# Patient Record
Sex: Male | Born: 1990 | Race: White | Hispanic: No | Marital: Single | Smoking: Current every day smoker
Health system: Southern US, Community
[De-identification: ages and names within clinical notes are randomized; demographics above are authoritative.]

## PROBLEM LIST (undated history)

## (undated) DIAGNOSIS — J45909 Unspecified asthma, uncomplicated: Secondary | ICD-10-CM

---

## 2011-05-16 ENCOUNTER — Emergency Department (HOSPITAL_COMMUNITY)
Admission: EM | Admit: 2011-05-16 | Discharge: 2011-05-16 | Disposition: A | Discharge status: Home / Self Care | Payer: No Typology Code available for payment source | Attending: Emergency Medicine | Admitting: Emergency Medicine

## 2011-05-16 ENCOUNTER — Encounter: Payer: Self-pay | Admitting: *Deleted

## 2011-05-16 ENCOUNTER — Emergency Department (HOSPITAL_COMMUNITY): Payer: No Typology Code available for payment source

## 2011-05-16 DIAGNOSIS — IMO0002 Reserved for concepts with insufficient information to code with codable children: Secondary | ICD-10-CM | POA: Insufficient documentation

## 2011-05-16 DIAGNOSIS — T148XXA Other injury of unspecified body region, initial encounter: Secondary | ICD-10-CM

## 2011-05-16 DIAGNOSIS — F17200 Nicotine dependence, unspecified, uncomplicated: Secondary | ICD-10-CM | POA: Insufficient documentation

## 2011-05-16 MED ORDER — DIAZEPAM 5 MG PO TABS: 5.0000 mg | ORAL_TABLET | Freq: Three times a day (TID) | ORAL | Status: AC | PRN

## 2011-05-16 MED ORDER — IBUPROFEN 800 MG PO TABS
800.0000 mg | ORAL_TABLET | Freq: Once | ORAL | Status: AC
  Administered 2011-05-16: 800 mg via ORAL
  Filled 2011-05-16: qty 1

## 2011-05-16 MED ORDER — DIAZEPAM 5 MG PO TABS
5.0000 mg | ORAL_TABLET | Freq: Once | ORAL | Status: AC
  Administered 2011-05-16: 5 mg via ORAL
  Filled 2011-05-16: qty 1

## 2011-05-16 MED ORDER — IBUPROFEN 600 MG PO TABS: 600.0000 mg | ORAL_TABLET | Freq: Four times a day (QID) | ORAL | Status: AC | PRN
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
## 2011-05-16 NOTE — ED Notes (Signed)
Pt taken to xray. Nad.  

## 2011-05-16 NOTE — ED Notes (Signed)
c collar applied. Pt c/o pain to sides of neck but had pain with palpation to cervical and upper thoracic area.

## 2011-05-31 NOTE — ED Provider Notes (Signed)
Medical screening examination/treatment/procedure(s) were performed by non-physician practitioner and as supervising physician I was immediately available for consultation/collaboration. Damion Kant, MD, FACEP    Briele Lagasse L Briana Farner, MD 05/31/11 0042 

## 2018-10-22 ENCOUNTER — Other Ambulatory Visit: Payer: Self-pay

## 2018-10-22 ENCOUNTER — Encounter (HOSPITAL_COMMUNITY): Payer: Self-pay

## 2018-10-22 ENCOUNTER — Emergency Department (HOSPITAL_COMMUNITY): Payer: 59

## 2018-10-22 DIAGNOSIS — Y92410 Unspecified street and highway as the place of occurrence of the external cause: Secondary | ICD-10-CM | POA: Diagnosis not present

## 2018-10-22 DIAGNOSIS — Y999 Unspecified external cause status: Secondary | ICD-10-CM | POA: Diagnosis not present

## 2018-10-22 DIAGNOSIS — S93402A Sprain of unspecified ligament of left ankle, initial encounter: Secondary | ICD-10-CM | POA: Insufficient documentation

## 2018-10-22 DIAGNOSIS — S39012A Strain of muscle, fascia and tendon of lower back, initial encounter: Secondary | ICD-10-CM | POA: Diagnosis not present

## 2018-10-22 DIAGNOSIS — Y9389 Activity, other specified: Secondary | ICD-10-CM | POA: Diagnosis not present

## 2018-10-22 DIAGNOSIS — J45909 Unspecified asthma, uncomplicated: Secondary | ICD-10-CM | POA: Insufficient documentation

## 2018-10-22 DIAGNOSIS — S99912A Unspecified injury of left ankle, initial encounter: Secondary | ICD-10-CM | POA: Diagnosis present

## 2018-10-22 DIAGNOSIS — Z87891 Personal history of nicotine dependence: Secondary | ICD-10-CM | POA: Diagnosis not present
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
## 2018-10-23 ENCOUNTER — Emergency Department (HOSPITAL_COMMUNITY)
Admission: EM | Admit: 2018-10-23 | Discharge: 2018-10-23 | Disposition: A | Discharge status: Home / Self Care | Payer: 59 | Attending: Emergency Medicine | Admitting: Emergency Medicine

## 2018-10-23 ENCOUNTER — Emergency Department (HOSPITAL_COMMUNITY): Payer: 59

## 2018-10-23 DIAGNOSIS — S93402A Sprain of unspecified ligament of left ankle, initial encounter: Secondary | ICD-10-CM

## 2018-10-23 DIAGNOSIS — S39012A Strain of muscle, fascia and tendon of lower back, initial encounter: Secondary | ICD-10-CM

## 2018-10-23 HISTORY — DX: Unspecified asthma, uncomplicated: J45.909

## 2018-10-23 MED ORDER — IBUPROFEN 400 MG PO TABS
600.0000 mg | ORAL_TABLET | Freq: Once | ORAL | Status: AC
Start: 2018-10-23 — End: 2018-10-23
  Administered 2018-10-23: 600 mg via ORAL
  Filled 2018-10-23: qty 2

## 2018-10-23 NOTE — Discharge Instructions (Addendum)
Apply ice as needed.  Wear ankle splint orthotic as needed.  Take ibuprofen or naproxen as needed for pain. Take acetaminophen, if needed, for additional pain relief.
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
## 2021-06-07 ENCOUNTER — Other Ambulatory Visit: Payer: Self-pay

## 2021-06-07 ENCOUNTER — Encounter (HOSPITAL_COMMUNITY): Payer: Self-pay | Admitting: Emergency Medicine

## 2021-06-07 DIAGNOSIS — S0501XA Injury of conjunctiva and corneal abrasion without foreign body, right eye, initial encounter: Secondary | ICD-10-CM | POA: Diagnosis present

## 2021-06-07 DIAGNOSIS — F17210 Nicotine dependence, cigarettes, uncomplicated: Secondary | ICD-10-CM | POA: Insufficient documentation

## 2021-06-07 DIAGNOSIS — X58XXXA Exposure to other specified factors, initial encounter: Secondary | ICD-10-CM | POA: Insufficient documentation

## 2021-06-07 DIAGNOSIS — R040 Epistaxis: Secondary | ICD-10-CM | POA: Diagnosis not present

## 2021-06-07 DIAGNOSIS — J45909 Unspecified asthma, uncomplicated: Secondary | ICD-10-CM | POA: Insufficient documentation

## 2021-06-07 NOTE — ED Triage Notes (Signed)
Pt c/o right eye pain after working today. 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 He c/o eye pain with watering and blurred vision.

## 2021-06-08 ENCOUNTER — Emergency Department (HOSPITAL_COMMUNITY)
Admission: EM | Admit: 2021-06-08 | Discharge: 2021-06-08 | Disposition: A | Discharge status: Home / Self Care | Payer: 59 | Attending: Emergency Medicine | Admitting: Emergency Medicine

## 2021-06-08 DIAGNOSIS — S0501XA Injury of conjunctiva and corneal abrasion without foreign body, right eye, initial encounter: Secondary | ICD-10-CM

## 2021-06-08 DIAGNOSIS — H5711 Ocular pain, right eye: Secondary | ICD-10-CM

## 2021-06-08 MED ORDER — OXYMETAZOLINE HCL 0.05 % NA SOLN
1.0000 | Freq: Once | NASAL | Status: AC
  Administered 2021-06-08: 1 via NASAL
  Filled 2021-06-08: qty 30

## 2021-06-08 MED ORDER — ERYTHROMYCIN 5 MG/GM OP OINT: TOPICAL_OINTMENT | OPHTHALMIC | 0 refills | Status: DC

## 2021-06-08 MED ORDER — FLUORESCEIN SODIUM 1 MG OP STRP
1.0000 | ORAL_STRIP | Freq: Once | OPHTHALMIC | Status: AC
  Administered 2021-06-08: 1 via OPHTHALMIC
  Filled 2021-06-08: qty 1

## 2021-06-08 MED ORDER — TETRACAINE HCL 0.5 % OP SOLN
1.0000 [drp] | Freq: Once | OPHTHALMIC | Status: AC
  Administered 2021-06-08: 1 [drp] via OPHTHALMIC
  Filled 2021-06-08: qty 4
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
## 2021-06-08 NOTE — ED Provider Notes (Signed)
[facility] EMERGENCY DEPARTMENT Provider Note   CSN: [id number] Arrival date & time: 06/07/21  2333     History Chief Complaint  Patient presents with   Eye Pain    Thomas Gilbert is a 30 y.o. male.  HPI     This is a 30 year old male who presents with right eye pain.  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  He believes he either got a piece of dust or metal in his right eye.  Since that time he developed progressively worsening pain and blurry vision.  He states that he rinsed his eye copiously at home with minimal relief.  He does not wear corrective lenses or contacts.  Rates pain at 6 out of 10.  Of note, he also developed a nosebleed while out in triage.  Patient reports history of intermittent nosebleeds since he was a child.  He feels this is unrelated.  Past Medical History:  Diagnosis Date   Asthma     There are no problems to display for this patient.   History reviewed. No pertinent surgical history.     No family history on file.  Social History   Tobacco Use   Smoking status: Every Day    Types: Cigarettes   Smokeless tobacco: Never  Vaping Use   Vaping Use: Never used  Substance Use Topics   Alcohol use: No   Drug use: Not Currently    Types: Marijuana    Home Medications Prior to Admission medications   Medication Sig Start Date End Date Taking? Authorizing Provider  erythromycin ophthalmic ointment Place a 1/2 inch ribbon of ointment into the lower eyelid. 06/08/21  Yes Millisa Giarrusso, Mayer Masker, MD  ibuprofen (ADVIL,MOTRIN) 200 MG tablet Take 400 mg by mouth 2 (two) times daily as needed. For pain     [provider]    Allergies    Patient has no known allergies.  Review of Systems   Review of Systems  Constitutional:  Negative for fever.  HENT:  Positive for nosebleeds.   Eyes:  Positive for photophobia, pain, discharge, redness and visual disturbance.  All other systems reviewed and  are negative.  Physical Exam Updated Vital Signs BP (!) 140/93   Pulse 73   Temp 98.4 F (36.9 C)   Resp 18   Ht 1.905 m (6\' 3" )   Wt 99.8 kg   SpO2 100%   BMI 27.50 kg/m   Physical Exam Vitals and nursing note reviewed.  Constitutional:      Appearance: He is well-developed.  HENT:     Head: Normocephalic and atraumatic.     Nose:     Comments: Clot noted in the right naris, no septal hematoma    Mouth/Throat:     Mouth: Mucous membranes are moist.  Eyes:     General: Lids are normal. Lids are everted, no foreign bodies appreciated. Vision grossly intact.        Right eye: Discharge present. No foreign body or hordeolum.        Left eye: No foreign body, discharge or hordeolum.     Extraocular Movements: Extraocular movements intact.     Conjunctiva/sclera:     Right eye: Right conjunctiva is injected. No chemosis, exudate or hemorrhage.    Left eye: Left conjunctiva is not injected. No chemosis, exudate or hemorrhage.    Pupils: Pupils are equal, round, and reactive to light.  Comments: No obvious fluorescein uptake, no corneal abrasions noted, no foreign bodies or metal/rust noted.  Cardiovascular:     Rate and Rhythm: Normal rate and regular rhythm.  Pulmonary:     Effort: Pulmonary effort is normal. No respiratory distress.  Abdominal:     Palpations: Abdomen is soft.  Musculoskeletal:     Cervical back: Neck supple.  Skin:    General: Skin is warm and dry.  Neurological:     Mental Status: He is alert and oriented to person, place, and time.  Psychiatric:        Mood and Affect: Mood normal.    ED Results / Procedures / Treatments   Labs (all labs ordered are listed, but only abnormal results are displayed) Labs Reviewed - No data to display  EKG None  Radiology No results found.  Procedures Procedures   Medications Ordered in ED Medications  tetracaine (PONTOCAINE) 0.5 % ophthalmic solution 1 drop (1 drop Both Eyes Given 06/08/21 0118)   fluorescein ophthalmic strip 1 strip (1 strip Both Eyes Given 06/08/21 0118)    ED Course  I have reviewed the triage vital signs and the nursing notes.  Pertinent labs & imaging results that were available during my care of the patient were reviewed by me and considered in my medical decision making (see chart for details).    MDM Rules/Calculators/A&P                           Patient presents with right eye pain and foreign body sensation.  He is nontoxic and vital signs are reassuring.  He also developed a nosebleed while in the emergency room.  His eye exam does not show any obvious foreign body metal or otherwise.  Lids were everted.  He has no significant fluorescein uptake either.  However, with suspect occult corneal abrasion.  Patient was flushed with 1 L of saline.  Discussed with him that would treat him as a corneal abrasion and have him follow-up with ophthalmology for formal eye exam if symptoms persist.  Patient stated understanding.  After history, exam, and medical workup I feel the patient has been appropriately medically screened and is safe for discharge home. Pertinent diagnoses were discussed with the patient. Patient was given return precautions.  Final Clinical Impression(s) / ED Diagnoses Final diagnoses:  Pain of right eye  Abrasion of right cornea, initial encounter    Rx / DC Orders ED Discharge Orders          Ordered    erythromycin ophthalmic ointment        06/08/21 0232             Wilkie Aye, Mayer Masker, MD 06/08/21 [phone]

## 2021-06-08 NOTE — Progress Notes (Signed)
[facility] - Clinic Note  06/10/2021    CHIEF COMPLAINT Patient presents for Eye Problem  HISTORY OF PRESENT ILLNESS: Thomas Gilbert is a 30 y.o. male who presents to the clinic today for:   HPI   30 y/o male pt seen in [facility] ED 10.3.22.  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  Pt quickly developed eye pain, photophobia and FBS OD, that did not improve after rinsing his eye out with water and trying to sleep it off some.  Symptoms have not improved as of today.  No problems reported OS.  VA good OU.  No gtts.  Nothing like this has ever occurred before.  ED didn't find anything definitive, but referred pt here for eval of possible corneal abrasion OD. Last edited by Celine Mans, COA on 06/10/2021  8:24 AM.    Pt is here on ED referral for possible corneal abrasion, pt states Monday he was at work and feels like he got something in his eye, pt went to [facility] and was given erythromycin ointment, which he has not picked up yet, pt states the FBS is still there  Referring physician: Shon Baton, MD [address] [place],  [place] [address]  HISTORICAL INFORMATION:  Selected notes from the MEDICAL RECORD NUMBER Seen in [place] [facility] 10.3.22; referred for poss. corneal abrasion OD.   CURRENT MEDICATIONS: Current Outpatient Medications (Ophthalmic Drugs)  Medication Sig   erythromycin ophthalmic ointment Place a 1/2 inch ribbon of ointment into the lower eyelid four times a day   No current facility-administered medications for this visit. (Ophthalmic Drugs)   Current Outpatient Medications (Other)  Medication Sig   ibuprofen (ADVIL) 800 MG tablet Take one po tid x 10 days   HYDROcodone-acetaminophen (NORCO/VICODIN) 5-325 MG tablet Take 1 tablet by mouth every 6 (six) hours as needed.   ibuprofen (ADVIL,MOTRIN) 200 MG tablet Take 400 mg by mouth 2 (two) times daily as needed. For pain    No  current facility-administered medications for this visit. (Other)   REVIEW OF SYSTEMS: ROS   Positive for: Eyes, Respiratory Negative for: Constitutional, Gastrointestinal, Neurological, Skin, Genitourinary, Musculoskeletal, HENT, Endocrine, Cardiovascular, Psychiatric, Allergic/Imm, Heme/Lymph Last edited by Celine Mans, COA on 06/10/2021  8:24 AM.    ALLERGIES No Known Allergies  PAST MEDICAL HISTORY Past Medical History:  Diagnosis Date   Asthma    History reviewed. No pertinent surgical history.  FAMILY HISTORY History reviewed. No pertinent family history.  SOCIAL HISTORY Social History   Tobacco Use   Smoking status: Every Day    Types: Cigarettes   Smokeless tobacco: Never  Vaping Use   Vaping Use: Never used  Substance Use Topics   Alcohol use: No   Drug use: Not Currently    Types: Marijuana       OPHTHALMIC EXAM:  Base Eye Exam     Visual Acuity (Snellen - Linear)       Right Left   Dist [place] 20/20 20/20         Tonometry (Tonopen, 8:25 AM)       Right Left   Pressure 13 15         Pupils       Dark Light Shape React APD   Right 4 3 Round Brisk None   Left 4 3 Round Brisk None  Visual Fields (Counting fingers)       Left Right    Full Full         Extraocular Movement       Right Left    Full, Ortho Full, Ortho         Neuro/Psych     Oriented x3: Yes   Mood/Affect: Normal         Dilation     Right eye: 1.0% Mydriacyl, 2.5% Phenylephrine @ 8:25 AM           Slit Lamp and Fundus Exam     Slit Lamp Exam       Right Left   Lids/Lashes Normal Normal   Conjunctiva/Sclera White and quiet White and quiet   Cornea Fleck metallic foreign body inferior paracentral Clear   Anterior Chamber Deep and quiet Deep and quiet   Iris Round and dilated Round and reactive   Lens Clear Clear   Vitreous clear          Fundus Exam       Right Left   Disc Pink and Sharp    C/D Ratio 0.3    Macula Flat,  Good foveal reflex, No heme or edema    Vessels Normal    Periphery Attached               Refraction     Manifest Refraction       Sphere Cylinder Axis Dist VA   Right [place] +0.25 180 20/20   Left -0.25 Sphere  20/20           IMAGING AND PROCEDURES  Imaging and Procedures for 06/10/2021  OCT, Retina - OU - Both Eyes       Right Eye Quality was good. Central Foveal Thickness: 270. Progression has no prior data. Findings include normal foveal contour, no IRF, no SRF, vitreomacular adhesion .   Left Eye Quality was good. Central Foveal Thickness: 277. Progression has no prior data. Findings include normal foveal contour, no IRF, no SRF, vitreomacular adhesion .   Notes *Images captured and stored on drive  Diagnosis / Impression:  NFP, no IRF/SRF OU  Clinical management:  See below  Abbreviations: NFP - Normal foveal profile. CME - cystoid macular edema. PED - pigment epithelial detachment. IRF - intraretinal fluid. SRF - subretinal fluid. EZ - ellipsoid zone. ERM - epiretinal membrane. ORA - outer retinal atrophy. ORT - outer retinal tubulation. SRHM - subretinal hyper-reflective material. IRHM - intraretinal hyper-reflective material      Foreign Body Removal, Cornea - OD - Right Eye       PROCEDURE NOTE  DIAGNOSIS:   Corneal foreign body, LEFT EYE  PROCEDURE:  Foreign body removal, LEFT EYE  SURGEON:  Rennis Chris, M.D.,Ph.D.   ANESTHESIA:  Topical    Following instillation of proparacaine and fluorescein into the right eye, the patient was positioned in the slit lamp. A small metallic foreign body at 6 oclock paracentral cornea OD was removed using a 27g needle on a 1cc syringe. The patient tolerated the procedure well. There were no complications. 1 drop of ofloxacin oph soln was instilled into the right eye. Discharge instructions were reviewed.            ASSESSMENT/PLAN:    ICD-10-CM   1. Foreign body of right cornea, initial encounter   T15.01XA Foreign Body Removal, Cornea - OD - Right Eye    2. Retinal edema  H35.81 OCT, Retina - OU - Both  Eyes    Metallic corneal foreign body OD  - pt reports cutting a metal pipe at work at Monday, 10.03.22, when he felt something in eye  - reported to [facility] ED on Tuesday, was given erythromycin ung, which he did not pick up  - exam shows fleck metallic foreign body w/ rust inferior paracentral cornea OD  - FB removed at slit lamp with 27g needle and sterile cotton tipped swab without complication -- see procedure ntoe  - start erythromycin ointment QID OD  - f/u Tuesdsay am for recheck  2. No retinal edema on exam or OCT  Ophthalmic Meds Ordered this visit:  Meds ordered this encounter  Medications   DISCONTD: erythromycin ophthalmic ointment    Sig: Place a 1/2 inch ribbon of ointment into the lower eyelid four times a day    Dispense:  3.5 g    Refill:  0   erythromycin ophthalmic ointment    Sig: Place a 1/2 inch ribbon of ointment into the lower eyelid four times a day    Dispense:  3.5 g    Refill:  0     Return for f/u next Tuesday or Wednesday, corneal foreign body OD, DFE, OCT.  There are no Patient Instructions on file for this visit.  Explained the diagnoses, plan, and follow up with the patient and they expressed understanding.  Patient expressed understanding of the importance of proper follow up care.   This document serves as a record of services personally performed by Karie Chimera, MD, PhD. It was created on their behalf by Cristopher Estimable, COT an ophthalmic technician. The creation of this record is the provider's dictation and/or activities during the visit.    Electronically signed by: Cristopher Estimable, COT 10.4.22 @ 1:11 PM   Karie Chimera, M.D., Ph.D. Diseases & Surgery of the Retina and Vitreous [facility]  I have reviewed the above documentation for accuracy and completeness, and I agree with the above. Karie Chimera,  M.D., Ph.D. 06/10/21 1:11 PM   Abbreviations: M myopia (nearsighted); A astigmatism; H hyperopia (farsighted); P presbyopia; Mrx spectacle prescription;  CTL contact lenses; OD right eye; OS left eye; OU both eyes  XT exotropia; ET esotropia; PEK punctate epithelial keratitis; PEE punctate epithelial erosions; DES dry eye syndrome; MGD meibomian gland dysfunction; ATs artificial tears; PFAT's preservative free artificial tears; NSC nuclear sclerotic cataract; PSC posterior subcapsular cataract; ERM epi-retinal membrane; PVD posterior vitreous detachment; RD retinal detachment; DM diabetes mellitus; DR diabetic retinopathy; NPDR non-proliferative diabetic retinopathy; PDR proliferative diabetic retinopathy; CSME clinically significant macular edema; DME diabetic macular edema; dbh dot blot hemorrhages; CWS cotton wool spot; POAG primary open angle glaucoma; C/D cup-to-disc ratio; HVF humphrey visual field; GVF goldmann visual field; OCT optical coherence tomography; IOP intraocular pressure; BRVO Branch retinal vein occlusion; CRVO central retinal vein occlusion; CRAO central retinal artery occlusion; BRAO branch retinal artery occlusion; RT retinal tear; SB scleral buckle; PPV pars plana vitrectomy; VH Vitreous hemorrhage; PRP panretinal laser photocoagulation; IVK intravitreal kenalog; VMT vitreomacular traction; MH Macular hole;  NVD neovascularization of the disc; NVE neovascularization elsewhere; AREDS age related eye disease study; ARMD age related macular degeneration; POAG primary open angle glaucoma; EBMD epithelial/anterior basement membrane dystrophy; ACIOL anterior chamber intraocular lens; IOL intraocular lens; PCIOL posterior chamber intraocular lens; Phaco/IOL phacoemulsification with intraocular lens placement; PRK photorefractive keratectomy; LASIK laser assisted in situ keratomileusis; HTN hypertension; DM diabetes mellitus; COPD chronic obstructive pulmonary disease

## 2021-06-10 ENCOUNTER — Other Ambulatory Visit: Payer: Self-pay

## 2021-06-10 ENCOUNTER — Encounter (INDEPENDENT_AMBULATORY_CARE_PROVIDER_SITE_OTHER): Payer: Self-pay | Admitting: Ophthalmology

## 2021-06-10 ENCOUNTER — Ambulatory Visit (INDEPENDENT_AMBULATORY_CARE_PROVIDER_SITE_OTHER): Payer: 59 | Admitting: Ophthalmology

## 2021-06-10 DIAGNOSIS — H3581 Retinal edema: Secondary | ICD-10-CM

## 2021-06-10 DIAGNOSIS — T1501XA Foreign body in cornea, right eye, initial encounter: Secondary | ICD-10-CM | POA: Diagnosis not present

## 2021-06-10 MED ORDER — ERYTHROMYCIN 5 MG/GM OP OINT: TOPICAL_OINTMENT | OPHTHALMIC | 0 refills | Status: AC

## 2021-06-10 MED ORDER — ERYTHROMYCIN 5 MG/GM OP OINT: TOPICAL_OINTMENT | OPHTHALMIC | 0 refills | Status: DC

## 2021-06-10 NOTE — Progress Notes (Signed)
[facility] - Clinic Note  06/15/2021    CHIEF COMPLAINT Patient presents for Retina Follow Up  HISTORY OF PRESENT ILLNESS: Thomas Gilbert is a 30 y.o. male who presents to the clinic today for:   HPI     Retina Follow Up   Patient presents with  Other.  In right eye.  This started 5 days ago.  I, the attending physician,  performed the HPI with the patient and updated documentation appropriately.        Comments   Patient here for 5 days retina follow up for FB OD. Patient states vision doing good. Still has moments that is cloudy. That has been getting better over the last few days. Had dry eyes for couple days after FB removed that went away.      Last edited by Rennis Chris, MD on 06/15/2021  9:08 AM.    Pt states his eye is getting better, he is still using PSO Ung QID, he states his eye has moments where it gets dry  Referring physician: Gareth Morgan, MD [address] [place],  [place] [address]  HISTORICAL INFORMATION:  Selected notes from the MEDICAL RECORD NUMBER Seen in [facility] ER 10.3.22; referred for poss. corneal abrasion OD.   CURRENT MEDICATIONS: Current Outpatient Medications (Ophthalmic Drugs)  Medication Sig   erythromycin ophthalmic ointment Place a 1/2 inch ribbon of ointment into the lower eyelid four times a day   No current facility-administered medications for this visit. (Ophthalmic Drugs)   Current Outpatient Medications (Other)  Medication Sig   HYDROcodone-acetaminophen (NORCO/VICODIN) 5-325 MG tablet Take 1 tablet by mouth every 6 (six) hours as needed.   ibuprofen (ADVIL) 800 MG tablet Take one po tid x 10 days   ibuprofen (ADVIL,MOTRIN) 200 MG tablet Take 400 mg by mouth 2 (two) times daily as needed. For pain    No current facility-administered medications for this visit. (Other)   REVIEW OF SYSTEMS: ROS   Positive for: Eyes, Respiratory Negative for: Constitutional, Gastrointestinal, Neurological, Skin,  Genitourinary, Musculoskeletal, HENT, Endocrine, Cardiovascular, Psychiatric, Allergic/Imm, Heme/Lymph Last edited by Laddie Aquas, COA on 06/15/2021  8:35 AM.     ALLERGIES No Known Allergies  PAST MEDICAL HISTORY Past Medical History:  Diagnosis Date   Asthma    History reviewed. No pertinent surgical history.  FAMILY HISTORY History reviewed. No pertinent family history.  SOCIAL HISTORY Social History   Tobacco Use   Smoking status: Every Day    Types: Cigarettes   Smokeless tobacco: Never  Vaping Use   Vaping Use: Never used  Substance Use Topics   Alcohol use: No   Drug use: Not Currently    Types: Marijuana       OPHTHALMIC EXAM:  Base Eye Exam     Visual Acuity (Snellen - Linear)       Right Left   Dist [place] 20/20 20/20         Tonometry (Tonopen, 8:31 AM)       Right Left   Pressure 13 13         Pupils       Dark Light Shape React APD   Right 4 3 Round Brisk None   Left 4 3 Round Brisk None         Visual Fields (Counting fingers)       Left Right    Full Full         Extraocular Movement  Right Left    Full, Ortho Full, Ortho         Neuro/Psych     Oriented x3: Yes   Mood/Affect: Normal         Dilation     Right eye: 1.0% Mydriacyl, 2.5% Phenylephrine @ 8:31 AM           Slit Lamp and Fundus Exam     Slit Lamp Exam       Right Left   Lids/Lashes Normal Normal   Conjunctiva/Sclera White and quiet White and quiet   Cornea Fleck metallic foreign body inferior paracentral - removed, epithelium closed/healed; focal corneal haze, early scar inferior paracentral Clear   Anterior Chamber Deep and quiet Deep and quiet   Iris Round and dilated Round and reactive   Lens Clear Clear   Vitreous clear          Fundus Exam       Right Left   Disc Pink and Sharp    C/D Ratio 0.3    Macula Flat, Good foveal reflex, No heme or edema    Vessels Normal    Periphery Attached                IMAGING AND PROCEDURES  Imaging and Procedures for 06/15/2021  OCT, Retina - OU - Both Eyes       Right Eye Quality was good. Central Foveal Thickness: 272. Progression has been stable. Findings include normal foveal contour, no IRF, no SRF, vitreomacular adhesion .   Left Eye Quality was good. Central Foveal Thickness: 279. Progression has been stable. Findings include normal foveal contour, no IRF, no SRF, vitreomacular adhesion .   Notes *Images captured and stored on drive  Diagnosis / Impression:  NFP, no IRF/SRF OU  Clinical management:  See below  Abbreviations: NFP - Normal foveal profile. CME - cystoid macular edema. PED - pigment epithelial detachment. IRF - intraretinal fluid. SRF - subretinal fluid. EZ - ellipsoid zone. ERM - epiretinal membrane. ORA - outer retinal atrophy. ORT - outer retinal tubulation. SRHM - subretinal hyper-reflective material. IRHM - intraretinal hyper-reflective material             ASSESSMENT/PLAN:    ICD-10-CM   1. Foreign body of right cornea, subsequent encounter  T15.01XD     2. Retinal edema  H35.81 OCT, Retina - OU - Both Eyes     Metallic corneal foreign body OD  - pt reports cutting a metal pipe at work at Monday, 10.03.22, when he felt something in eye  - reported to [facility] ED on Tuesday, was given erythromycin ung, which he did not pick up  - exam showed fleck metallic foreign body w/ rust inferior paracentral cornea OD  - FB removed at slit lamp with 27g needle and sterile cotton tipped swab without complication on 10.6.22  - today, epithelium closed/healed  - ok to stop erythromycin ointment  - f/u PRN  2. No retinal edema on exam or OCT  Ophthalmic Meds Ordered this visit:  No orders of the defined types were placed in this encounter.    Return if symptoms worsen or fail to improve.  There are no Patient Instructions on file for this visit.  Explained the diagnoses, plan, and follow up with the patient  and they expressed understanding.  Patient expressed understanding of the importance of proper follow up care.   This document serves as a record of services personally performed by Karie Chimera, MD, PhD.  It was created on their behalf by Glee Arvin. Manson Passey, OA an ophthalmic technician. The creation of this record is the provider's dictation and/or activities during the visit.    Electronically signed by: Glee Arvin. Manson Passey, [place] 10.11.2022 9:13 AM  Karie Chimera, M.D., Ph.D. Diseases & Surgery of the Retina and Vitreous [facility]  I have reviewed the above documentation for accuracy and completeness, and I agree with the above. Karie Chimera, M.D., Ph.D. 06/15/21 9:13 AM  Abbreviations: M myopia (nearsighted); A astigmatism; H hyperopia (farsighted); P presbyopia; Mrx spectacle prescription;  CTL contact lenses; OD right eye; OS left eye; OU both eyes  XT exotropia; ET esotropia; PEK punctate epithelial keratitis; PEE punctate epithelial erosions; DES dry eye syndrome; MGD meibomian gland dysfunction; ATs artificial tears; PFAT's preservative free artificial tears; NSC nuclear sclerotic cataract; PSC posterior subcapsular cataract; ERM epi-retinal membrane; PVD posterior vitreous detachment; RD retinal detachment; DM diabetes mellitus; DR diabetic retinopathy; NPDR non-proliferative diabetic retinopathy; PDR proliferative diabetic retinopathy; CSME clinically significant macular edema; DME diabetic macular edema; dbh dot blot hemorrhages; CWS cotton wool spot; POAG primary open angle glaucoma; C/D cup-to-disc ratio; HVF humphrey visual field; GVF goldmann visual field; OCT optical coherence tomography; IOP intraocular pressure; BRVO Branch retinal vein occlusion; CRVO central retinal vein occlusion; CRAO central retinal artery occlusion; BRAO branch retinal artery occlusion; RT retinal tear; SB scleral buckle; PPV pars plana vitrectomy; VH Vitreous hemorrhage; PRP panretinal  laser photocoagulation; IVK intravitreal kenalog; VMT vitreomacular traction; MH Macular hole;  NVD neovascularization of the disc; NVE neovascularization elsewhere; AREDS age related eye disease study; ARMD age related macular degeneration; POAG primary open angle glaucoma; EBMD epithelial/anterior basement membrane dystrophy; ACIOL anterior chamber intraocular lens; IOL intraocular lens; PCIOL posterior chamber intraocular lens; Phaco/IOL phacoemulsification with intraocular lens placement; PRK photorefractive keratectomy; LASIK laser assisted in situ keratomileusis; HTN hypertension; DM diabetes mellitus; COPD chronic obstructive pulmonary disease

## 2021-06-15 ENCOUNTER — Encounter (INDEPENDENT_AMBULATORY_CARE_PROVIDER_SITE_OTHER): Payer: Self-pay | Admitting: Ophthalmology

## 2021-06-15 ENCOUNTER — Ambulatory Visit (INDEPENDENT_AMBULATORY_CARE_PROVIDER_SITE_OTHER): Payer: 59 | Admitting: Ophthalmology

## 2021-06-15 ENCOUNTER — Other Ambulatory Visit: Payer: Self-pay

## 2021-06-15 DIAGNOSIS — T1501XD Foreign body in cornea, right eye, subsequent encounter: Secondary | ICD-10-CM | POA: Diagnosis not present

## 2021-06-15 DIAGNOSIS — H3581 Retinal edema: Secondary | ICD-10-CM

## 2021-06-15 DIAGNOSIS — T1501XA Foreign body in cornea, right eye, initial encounter: Secondary | ICD-10-CM
